# Patient Record
Sex: Male | Born: 1970 | Race: Black or African American | Hispanic: No | Marital: Married | Smoking: Never smoker
Health system: Southern US, Community
[De-identification: ages and names within clinical notes are randomized; demographics above are authoritative.]

## PROBLEM LIST (undated history)

## (undated) DIAGNOSIS — I10 Essential (primary) hypertension: Secondary | ICD-10-CM

## (undated) HISTORY — PX: ACHILLES TENDON REPAIR: SUR1153

## (undated) HISTORY — PX: OTHER SURGICAL HISTORY: SHX169

---

## 2011-02-24 ENCOUNTER — Ambulatory Visit: Payer: Self-pay | Admitting: Internal Medicine

## 2011-02-24 DIAGNOSIS — Z0289 Encounter for other administrative examinations: Secondary | ICD-10-CM

## 2011-12-05 ENCOUNTER — Encounter (HOSPITAL_BASED_OUTPATIENT_CLINIC_OR_DEPARTMENT_OTHER): Payer: Self-pay | Admitting: *Deleted

## 2011-12-05 ENCOUNTER — Emergency Department (HOSPITAL_BASED_OUTPATIENT_CLINIC_OR_DEPARTMENT_OTHER)
Admission: EM | Admit: 2011-12-05 | Discharge: 2011-12-06 | Disposition: A | Discharge status: Home / Self Care | Payer: BC Managed Care – PPO | Attending: Emergency Medicine | Admitting: Emergency Medicine

## 2011-12-05 DIAGNOSIS — R079 Chest pain, unspecified: Secondary | ICD-10-CM | POA: Insufficient documentation

## 2011-12-05 NOTE — ED Provider Notes (Signed)
History  This chart was scribed for Chris Seamen, MD by Erskine Emery. This patient was seen in room MH05/[facility]05 and the patient's care was started at 23:46.   CSN: [id number]  Arrival date & time 12/05/11  2334   First MD Initiated Contact with Patient 12/05/11 2346      Chief Complaint  Patient presents with  . Chest Pain    (Consider location/radiation/quality/duration/timing/severity/associated sxs/prior treatment) The history is provided by the patient. No language interpreter was used.  Chris Burke is a 41 y.o. male who presents to the Emergency Department complaining of sudden onset sharp pain, well localized to the left chest, in episodes of one second since 9 pm tonight. Pt reports about 10 episodes since onset. Pt denies any associated SOB, abdominal pain, nausea, emesis, diaphoresis, cough, or previous episodes of similar symptoms. There doesn't appear to be any mitigating or exacerbation factors.  History reviewed. No pertinent past medical history.  Past Surgical History  Procedure Date  . Achilles     History reviewed. No pertinent family history.  History  Substance Use Topics  . Smoking status: Never Smoker   . Smokeless tobacco: Not on file  . Alcohol Use: No      Review of Systems A complete 10 system review of systems was obtained and all systems are negative except as noted in the HPI and PMH.    Allergies  Review of patient's allergies indicates no known allergies.  Home Medications  No current outpatient prescriptions on file.  Triage Vitals: BP 137/89  Pulse 55  Temp 97.8 F (36.6 C) (Oral)  Resp 16  Ht 5\' 7"  (1.702 m)  Wt 170 lb (77.111 kg)  BMI 26.63 kg/m2  SpO2 100%  Physical Exam  Nursing note and vitals reviewed. Constitutional: He is oriented to person, place, and time. He appears well-developed and well-nourished. No distress.  HENT:  Head: Normocephalic and atraumatic.  Eyes: EOM are normal. Pupils are equal, round, and  reactive to light.  Neck: Neck supple. No tracheal deviation present.  Cardiovascular: Normal rate, regular rhythm and normal heart sounds.   Pulmonary/Chest: Effort normal and breath sounds normal. No respiratory distress.  Abdominal: Soft. He exhibits no distension.  Musculoskeletal: Normal range of motion. He exhibits no edema.  Neurological: He is alert and oriented to person, place, and time.  Skin: Skin is warm and dry.  Psychiatric: He has a normal mood and affect.    ED Course  Procedures (including critical care time) DIAGNOSTIC STUDIES: Oxygen Saturation is 100% on room air, normal by my interpretation.    COORDINATION OF CARE: 23:55--I evaluated the patient and we discussed a treatment plan including chest x-ray and blood work to which the pt agreed. I notified the pt that his EKG looks fine.     MDM  Nursing notes and vitals signs, including pulse oximetry, reviewed.  Summary of this visit's results, reviewed by myself:  Labs:  Results for orders placed during the hospital encounter of 12/05/11  TROPONIN I      Component Value Range   Troponin I <0.30  <0.30 ng/mL    Imaging Studies: Dg Chest 2 View  12/06/2011  *RADIOLOGY REPORT*  Clinical Data: Left-sided chest pain.  CHEST - 2 VIEW  Comparison: None.  Findings: Lateral view degraded by patient arm position.  Midline trachea.  Normal heart size and mediastinal contours. No pleural effusion or pneumothorax.  Clear lungs.  IMPRESSION: No acute cardiopulmonary disease.   Original Report Authenticated  By: Consuello Bossier, M.D.    1:10 AM Patient symptoms are not characteristic of pain of cardiac origin. His EKG is normal except for sinus bradycardia consistent with patient's history of being athletic.    Date: 12/05/2011 11:41 PM  Rate: 56  Rhythm: Sinus bradycardia  QRS Axis: normal  Intervals: normal  ST/T Wave abnormalities: normal  Conduction Disutrbances: none  Narrative Interpretation: unremarkable   Comparison with previous EKG: None available  Well's Score for PE: 0    I personally performed the services described in this documentation, which was scribed in my presence.  The recorded information has been reviewed and considered.       Chris Seamen, MD 12/06/11 0110

## 2011-12-05 NOTE — ED Notes (Signed)
C/o left sided chest pain since pm otnight, denies SOB, no n/v.

## 2011-12-06 ENCOUNTER — Emergency Department (HOSPITAL_BASED_OUTPATIENT_CLINIC_OR_DEPARTMENT_OTHER): Payer: BC Managed Care – PPO

## 2011-12-06 LAB — TROPONIN I: Troponin I: <0.3 ng/mL (ref <0.30)

## 2011-12-06 MED ORDER — ASPIRIN 81 MG PO CHEW
324.0000 mg | CHEWABLE_TABLET | Freq: Once | ORAL | Status: AC
  Administered 2011-12-06: 324 mg via ORAL
  Filled 2011-12-06: qty 4

## 2015-06-14 ENCOUNTER — Encounter (HOSPITAL_BASED_OUTPATIENT_CLINIC_OR_DEPARTMENT_OTHER): Payer: Self-pay | Admitting: *Deleted

## 2015-06-14 ENCOUNTER — Emergency Department (HOSPITAL_BASED_OUTPATIENT_CLINIC_OR_DEPARTMENT_OTHER): Payer: BLUE CROSS/BLUE SHIELD

## 2015-06-14 ENCOUNTER — Emergency Department (HOSPITAL_BASED_OUTPATIENT_CLINIC_OR_DEPARTMENT_OTHER)
Admission: EM | Admit: 2015-06-14 | Discharge: 2015-06-14 | Disposition: A | Discharge status: Home / Self Care | Payer: BLUE CROSS/BLUE SHIELD | Attending: Emergency Medicine | Admitting: Emergency Medicine

## 2015-06-14 DIAGNOSIS — Y998 Other external cause status: Secondary | ICD-10-CM | POA: Insufficient documentation

## 2015-06-14 DIAGNOSIS — Y92410 Unspecified street and highway as the place of occurrence of the external cause: Secondary | ICD-10-CM | POA: Diagnosis not present

## 2015-06-14 DIAGNOSIS — S3992XA Unspecified injury of lower back, initial encounter: Secondary | ICD-10-CM | POA: Diagnosis present

## 2015-06-14 DIAGNOSIS — Y9389 Activity, other specified: Secondary | ICD-10-CM | POA: Insufficient documentation

## 2015-06-14 MED ORDER — CYCLOBENZAPRINE HCL 10 MG PO TABS: 10.0000 mg | ORAL_TABLET | Freq: Two times a day (BID) | ORAL | Status: DC | PRN

## 2015-06-14 MED ORDER — HYDROCODONE-ACETAMINOPHEN 5-325 MG PO TABS: 2.0000 | ORAL_TABLET | ORAL | Status: DC | PRN

## 2015-06-14 MED ORDER — IBUPROFEN 600 MG PO TABS: 600.0000 mg | ORAL_TABLET | Freq: Four times a day (QID) | ORAL | Status: DC | PRN
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
## 2015-06-14 NOTE — ED Notes (Signed)
Pt given d/c instructions as per chart. Verbalizes understanding. No questions. Rx x 3 with narcotic prec instructions.
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
## 2015-06-14 NOTE — Discharge Instructions (Signed)

## 2015-09-30 ENCOUNTER — Emergency Department (HOSPITAL_BASED_OUTPATIENT_CLINIC_OR_DEPARTMENT_OTHER)
Admission: EM | Admit: 2015-09-30 | Discharge: 2015-09-30 | Disposition: A | Discharge status: Home / Self Care | Payer: BLUE CROSS/BLUE SHIELD | Attending: Emergency Medicine | Admitting: Emergency Medicine

## 2015-09-30 ENCOUNTER — Encounter (HOSPITAL_BASED_OUTPATIENT_CLINIC_OR_DEPARTMENT_OTHER): Payer: Self-pay | Admitting: Emergency Medicine

## 2015-09-30 DIAGNOSIS — M542 Cervicalgia: Secondary | ICD-10-CM | POA: Diagnosis present

## 2015-09-30 DIAGNOSIS — M62838 Other muscle spasm: Secondary | ICD-10-CM | POA: Insufficient documentation

## 2015-09-30 MED ORDER — CYCLOBENZAPRINE HCL 10 MG PO TABS: 10.0000 mg | ORAL_TABLET | Freq: Two times a day (BID) | ORAL | 0 refills | Status: DC | PRN

## 2015-09-30 MED ORDER — IBUPROFEN 600 MG PO TABS: 600.0000 mg | ORAL_TABLET | Freq: Four times a day (QID) | ORAL | 0 refills | Status: DC | PRN

## 2015-09-30 NOTE — ED Notes (Signed)
Waiting for edp eval.

## 2015-09-30 NOTE — ED Provider Notes (Signed)
[facility]   CSN: [id number] Arrival date & time: 09/30/15  1431  First Provider Contact:  First MD Initiated Contact with Patient 09/30/15 1520        History   Chief Complaint Chief Complaint  Patient presents with  . Neck Pain    HPI Chris Burke is a 45 y.o. male.  HPI Patient developed a tightening, spasming sensation in the left side of his neck at about 11 PM. No associated injury. He reports it feels a little tighter feet turned his head away from that side of the neck. He has no arm weakness numbness or tingling. No associated headache or jaw pain. No sustained chest pain or shortness of breath. No recent injury. 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 He denies he's doing any significant physical activities however. No past medical history on file.  There are no active problems to display for this patient.   Past Surgical History:  Procedure Laterality Date  . achilles         Home Medications    Prior to Admission medications   Medication Sig Start Date End Date Taking? Authorizing Provider  cyclobenzaprine (FLEXERIL) 10 MG tablet Take 1 tablet (10 mg total) by mouth 2 (two) times daily as needed for muscle spasms. 06/14/15   Nelva Nayobert Beaton, MD  cyclobenzaprine (FLEXERIL) 10 MG tablet Take 1 tablet (10 mg total) by mouth 2 (two) times daily as needed for muscle spasms. 09/30/15   Arby BarretteMarcy Kymora Sciara, MD  HYDROcodone-acetaminophen (NORCO/VICODIN) 5-325 MG tablet Take 2 tablets by mouth every 4 (four) hours as needed. 06/14/15   Nelva Nayobert Beaton, MD  ibuprofen (ADVIL,MOTRIN) 600 MG tablet Take 1 tablet (600 mg total) by mouth every 6 (six) hours as needed. 06/14/15   Nelva Nayobert Beaton, MD  ibuprofen (ADVIL,MOTRIN) 600 MG tablet Take 1 tablet (600 mg total) by mouth every 6 (six) hours as needed. 09/30/15   Arby BarretteMarcy Talin Feister, MD    Family History No family history on file.  Social History Social History    Substance Use Topics  . Smoking status: Never Smoker  . Smokeless tobacco: Never Used  . Alcohol use No     Allergies   Review of patient's allergies indicates no known allergies.   Review of Systems Review of Systems 10 Systems reviewed and are negative for acute change except as noted in the HPI.   Physical Exam Updated Vital Signs BP 128/87 (BP Location: Right Arm)   Pulse (!) 57   Temp 98 F (36.7 C) (Oral)   Resp 18   Ht 5\' 8"  (1.727 m)   Wt 188 lb (85.3 kg)   SpO2 99%   BMI 28.59 kg/m   Physical Exam  Constitutional: He is oriented to person, place, and time. He appears well-developed and well-nourished. No distress.  HENT:  Head: Normocephalic and atraumatic.  Right Ear: External ear normal.  Left Ear: External ear normal.  Nose: Nose normal.  Mouth/Throat: Oropharynx is clear and moist.  Bilateral TMs normal. No facial or jaw swelling.  Eyes: EOM are normal.  Neck: Normal range of motion.  Patient has mild discomfort along the left sternocleidomastoid. He perceives increased discomfort turning his head to the right. No carotid bruit. No palpable masses. No thyromegaly. No meningismus.  Cardiovascular: Normal rate, regular rhythm, normal heart sounds and intact distal pulses.  Exam reveals no gallop and no friction rub.   No murmur heard. Pulmonary/Chest: Effort normal  and breath sounds normal.  Musculoskeletal: Normal range of motion.  Bilateral Upper extremities normal sensation and strength testing.  Neurological: He is alert and oriented to person, place, and time. No cranial nerve deficit. He exhibits normal muscle tone. Coordination normal.  Skin: Skin is warm and dry.  Psychiatric: He has a normal mood and affect.     ED Treatments / Results  Labs (all labs ordered are listed, but only abnormal results are displayed) Labs Reviewed - No data to display  EKG  EKG Interpretation None       Radiology No results  found.  Procedures Procedures (including critical care time)  Medications Ordered in ED Medications - No data to display   Initial Impression / Assessment and Plan / ED Course  I have reviewed the triage vital signs and the nursing notes.  Pertinent labs & imaging results that were available during my care of the patient were reviewed by me and considered in my medical decision making (see chart for details).  Clinical Course     Final Clinical Impressions(s) / ED Diagnoses   Final diagnoses:  Muscle spasms of neck   Patient perceives tightness along the SCM on the left. No specific recalled injury. No associated neurologic dysfunction. At this time recommend ibuprofen and Flexeril. New Prescriptions New Prescriptions   CYCLOBENZAPRINE (FLEXERIL) 10 MG TABLET    Take 1 tablet (10 mg total) by mouth 2 (two) times daily as needed for muscle spasms.   IBUPROFEN (ADVIL,MOTRIN) 600 MG TABLET    Take 1 tablet (600 mg total) by mouth every 6 (six) hours as needed.     Arby Barrette, MD 09/30/15 1540

## 2015-09-30 NOTE — ED Triage Notes (Signed)
Pt having left sided neck pain, started this am.  Pt denies injury.  Pain increased when he moved his neck.
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
## 2023-08-08 ENCOUNTER — Other Ambulatory Visit: Payer: Self-pay

## 2023-08-08 ENCOUNTER — Emergency Department (HOSPITAL_BASED_OUTPATIENT_CLINIC_OR_DEPARTMENT_OTHER): Admission: EM | Admit: 2023-08-08 | Discharge: 2023-08-08 | Disposition: A | Discharge status: Home / Self Care

## 2023-08-08 ENCOUNTER — Encounter (HOSPITAL_BASED_OUTPATIENT_CLINIC_OR_DEPARTMENT_OTHER): Payer: Self-pay

## 2023-08-08 ENCOUNTER — Emergency Department (HOSPITAL_BASED_OUTPATIENT_CLINIC_OR_DEPARTMENT_OTHER)

## 2023-08-08 DIAGNOSIS — E876 Hypokalemia: Secondary | ICD-10-CM | POA: Insufficient documentation

## 2023-08-08 DIAGNOSIS — Z79899 Other long term (current) drug therapy: Secondary | ICD-10-CM | POA: Diagnosis not present

## 2023-08-08 DIAGNOSIS — I10 Essential (primary) hypertension: Secondary | ICD-10-CM | POA: Diagnosis not present

## 2023-08-08 DIAGNOSIS — R072 Precordial pain: Secondary | ICD-10-CM | POA: Diagnosis present

## 2023-08-08 HISTORY — DX: Essential (primary) hypertension: I10

## 2023-08-08 LAB — CBC
HCT: 44 % (ref 39.0–52.0)
Hemoglobin: 15.5 g/dL (ref 13.0–17.0)
MCH: 31.2 pg (ref 26.0–34.0)
MCHC: 35.2 g/dL (ref 30.0–36.0)
MCV: 88.5 fL (ref 80.0–100.0)
Platelets: 239 10*3/uL (ref 150–400)
RBC: 4.97 MIL/uL (ref 4.22–5.81)
RDW: 13.5 % (ref 11.5–15.5)
WBC: 5.2 10*3/uL (ref 4.0–10.5)
nRBC: 0 % (ref 0.0–0.2)

## 2023-08-08 LAB — BASIC METABOLIC PANEL WITH GFR
Anion gap: 13 (ref 5–15)
BUN: 14 mg/dL (ref 6–20)
CO2: 26 mmol/L (ref 22–32)
Calcium: 9.1 mg/dL (ref 8.9–10.3)
Chloride: 100 mmol/L (ref 98–111)
Creatinine, Ser: 1.27 mg/dL — ABNORMAL HIGH (ref 0.61–1.24)
GFR, Estimated: >60 mL/min (ref >60)
Glucose, Bld: 138 mg/dL — ABNORMAL HIGH (ref 70–99)
Potassium: 3 mmol/L — ABNORMAL LOW (ref 3.5–5.1)
Sodium: 139 mmol/L (ref 135–145)

## 2023-08-08 LAB — TROPONIN T, HIGH SENSITIVITY: Troponin T High Sensitivity: <15 ng/L (ref <19)

## 2023-08-08 MED ORDER — POTASSIUM CHLORIDE CRYS ER 20 MEQ PO TBCR
40.0000 meq | EXTENDED_RELEASE_TABLET | Freq: Once | ORAL | Status: AC
  Administered 2023-08-08: 40 meq via ORAL
  Filled 2023-08-08: qty 2

## 2023-08-08 NOTE — Discharge Instructions (Addendum)
 Please read and follow all provided instructions.  Your diagnoses today include:  1. Precordial pain     Tests performed today include: An EKG of your heart A chest x-ray Cardiac enzymes - a blood test for heart muscle damage Blood counts and electrolytes: Potassium was low at 3.0, blood sugar was a little bit elevated at 138 your creatinine (kidney function was slightly elevated at 1.27 Vital signs. See below for your results today.   Medications prescribed:  None  Take any prescribed medications only as directed.  Follow-up instructions: Please follow-up with your primary care provider in 1 week for further evaluation of your symptoms.   If you do have concerns about your cardiac risk factors, you may be a candidate for risk stratification such as with a CT coronary calcium score. This is a conversation to have with your PCP or cardiologist to see if they might think this is appropriate for you.  You may try over-the-counter medication for reflux or GI symptoms to see if this helps as well.  Return instructions:  SEEK IMMEDIATE MEDICAL ATTENTION IF: You have severe chest pain, especially if the pain is crushing or pressure-like and spreads to the arms, back, neck, or jaw, or if you have sweating, nausea or vomiting, or trouble with breathing. THIS IS AN EMERGENCY. Do not wait to see if the pain will go away. Get medical help at once. Call 911. DO NOT drive yourself to the hospital.  Your chest pain gets worse and does not go away after a few minutes of rest.  You have an attack of chest pain lasting longer than what you usually experience.  You have significant dizziness, if you pass out, or have trouble walking.  You have chest pain not typical of your usual pain for which you originally saw your caregiver.  You have any other emergent concerns regarding your health.  Additional Information: Chest pain comes from many different causes. Your caregiver has diagnosed you as having  chest pain that is not specific for one problem, but does not require admission.  You are at low risk for an acute heart condition or other serious illness.   Your vital signs today were: BP (!) 146/96   Pulse 61   Temp 97.8 F (36.6 C) (Oral)   Resp 16   Ht 5' 8 (1.727 m)   Wt 83.9 kg   SpO2 97%   BMI 28.13 kg/m  If your blood pressure (BP) was elevated above 135/85 this visit, please have this repeated by your doctor within one month. --------------

## 2023-08-08 NOTE — ED Triage Notes (Signed)
 C/o constant left sided chest pain since yesterday. Had similar a month ago that resolved after 3 days.

## 2023-08-08 NOTE — ED Provider Notes (Signed)
 [place] [facility] Provider Note   CSN: [id number] Arrival date & time: 08/08/23  4098     Patient presents with: Chest Pain   Chris Burke is a 53 y.o. male.    Patient with history of hypertension, hyperlipidemia, prediabetes --presents to the emergency department for evaluation of left-sided chest pain.  Symptoms started yesterday.  Symptoms were worse this morning, eased off a bit this afternoon but not resolved.  Symptoms started at rest and are not made worse with exertion.  No vomiting or heavy sweating.  Pain does not radiate.  He thinks that it helped a little bit earlier when he was able to belch and also raising his hands up above his head.  Denies change with eating or drinking.  Patient denies history of tobacco use or family history of heart disease.  Patient was concerned because he has had a friend/neighbor have gas pains recently and it turned out to be a heart attack.  He reports having the same pain for 4 days about 1 month ago, resolved spontaneously.  He denies lower extremity swelling, calf or ankle pain.  He reports driving home from [place]  about a month ago.  Denies history of blood clots or recent surgeries.       Prior to Admission medications   Medication Sig Start Date End Date Taking? Authorizing Provider  cyclobenzaprine  (FLEXERIL ) 10 MG tablet Take 1 tablet (10 mg total) by mouth 2 (two) times daily as needed for muscle spasms. 06/14/15   Lonia Ro, MD  cyclobenzaprine  (FLEXERIL ) 10 MG tablet Take 1 tablet (10 mg total) by mouth 2 (two) times daily as needed for muscle spasms. 09/30/15   Wynetta Heckle, MD  HYDROcodone -acetaminophen  (NORCO/VICODIN) 5-325 MG tablet Take 2 tablets by mouth every 4 (four) hours as needed. 06/14/15   Lonia Ro, MD  ibuprofen  (ADVIL ,MOTRIN ) 600 MG tablet Take 1 tablet (600 mg total) by mouth every 6 (six) hours as needed. 06/14/15   Lonia Ro, MD  ibuprofen  (ADVIL ,MOTRIN ) 600  MG tablet Take 1 tablet (600 mg total) by mouth every 6 (six) hours as needed. 09/30/15   Wynetta Heckle, MD    Allergies: Patient has no known allergies.    Review of Systems  Updated Vital Signs BP (!) 146/96   Pulse 61   Temp 97.8 F (36.6 C) (Oral)   Resp 16   Ht 5' 8 (1.727 m)   Wt 83.9 kg   SpO2 97%   BMI 28.13 kg/m   Physical Exam Vitals and nursing note reviewed.  Constitutional:      Appearance: He is well-developed. He is not diaphoretic.  HENT:     Head: Normocephalic and atraumatic.     Mouth/Throat:     Mouth: Mucous membranes are not dry.   Eyes:     Conjunctiva/sclera: Conjunctivae normal.   Neck:     Vascular: Normal carotid pulses. No carotid bruit or JVD.     Trachea: Trachea normal. No tracheal deviation.   Cardiovascular:     Rate and Rhythm: Normal rate and regular rhythm.     Pulses: No decreased pulses.          Radial pulses are 2+ on the right side and 2+ on the left side.     Heart sounds: Normal heart sounds, S1 normal and S2 normal. Heart sounds not distant. No murmur heard. Pulmonary:     Effort: Pulmonary effort is normal. No respiratory distress.     Breath  sounds: Normal breath sounds. No wheezing.  Chest:     Chest wall: No tenderness.  Abdominal:     General: Bowel sounds are normal.     Palpations: Abdomen is soft.     Tenderness: There is no abdominal tenderness. There is no guarding or rebound.   Musculoskeletal:     Cervical back: Normal range of motion and neck supple. No muscular tenderness.     Right lower leg: No edema.     Left lower leg: No edema.   Skin:    General: Skin is warm and dry.     Coloration: Skin is not pale.   Neurological:     Mental Status: He is alert. Mental status is at baseline.   Psychiatric:        Mood and Affect: Mood normal.    ED Course  Patient seen and examined. History obtained directly from patient.   Labs/EKG: Ordered CBC, BMP, troponin.  EKG personally reviewed and  interpreted as below.  Imaging: Ordered chest x-ray.  Medications/Fluids: None ordered  Most recent vital signs reviewed and are as follows: BP (!) 144/93 (BP Location: Right Arm)   Pulse 67   Temp 97.8 F (36.6 C) (Oral)   Resp 20   Ht 5' 8 (1.727 m)   Wt 83.9 kg   SpO2 100%   BMI 28.13 kg/m   Initial impression: Atypical chest pain  5:34 PM Reassessment performed. Patient appears comfortable.  Wife now at bedside.  Labs personally reviewed and interpreted including: CBC unremarkable; BMP with slightly low potassium at 3.0, glucose 138, creatinine 1.27 otherwise unremarkable; troponin T less than 15.  Imaging personally visualized and interpreted including: Chest x-ray negative.  Reviewed pertinent lab work and imaging with patient at bedside. Questions answered.   Most current vital signs reviewed and are as follows: BP (!) 146/96   Pulse 61   Temp 97.8 F (36.6 C) (Oral)   Resp 16   Ht 5' 8 (1.727 m)   Wt 83.9 kg   SpO2 97%   BMI 28.13 kg/m   Plan: Discharge to home.  Will give oral potassium supplement prior to discharge.  Prescriptions written for: None  Other home care instructions discussed: Discussed trying an over-the-counter medication for GI symptoms such as omeprazole or Pepcid.  Family member states that they have Zegerid at home and he may try this.  In regards to hypokalemia, advised good dietary intake of hypokalemia.  Patient is not on any medications which would chronically lower his potassium and I do not feel strongly that he requires a oral supplement at this time, beyond the dose in the ED.  Return and follow-up instructions: I encouraged patient to return to ED with severe chest pain, especially if the pain is crushing or pressure-like and spreads to the arms, back, neck, or jaw, or if they have associated sweating, vomiting, or shortness of breath with the pain, or significant pain with activity. We discussed that the evaluation here today  indicates a low-risk of serious cause of chest pain, including heart trouble or a blood clot, but no evaluation is perfect and chest pain can evolve with time. The patient verbalized understanding and agreed.  I encouraged patient to follow-up with their provider in the next 1 week for recheck.     (all labs ordered are listed, but only abnormal results are displayed) Labs Reviewed - No data to display  EKG: None  Radiology: No results found.   Procedures  Medications Ordered in the ED - No data to display                                  Medical Decision Making Amount and/or Complexity of Data Reviewed Labs: ordered. Radiology: ordered.  Risk Prescription drug management.   For this patient's complaint of chest pain, the following emergent conditions were considered on the differential diagnosis: acute coronary syndrome, pulmonary embolism, pneumothorax, myocarditis, pericardial tamponade, aortic dissection, thoracic aortic aneurysm complication, esophageal perforation.   Other causes were also considered including: gastroesophageal reflux disease, musculoskeletal pain including costochondritis, pneumonia/pleurisy, herpes zoster, pericarditis.  In regards to possibility of ACS, patient has atypical features of pain, non-ischemic and unchanged EKG and negative troponin. Heart score was calculated to be 3.   In regards to possibility of PE, symptoms are atypical for PE and risk profile is low, and PE felt to be low likelihood.  No clinical signs or symptoms of DVT.  The patient's vital signs, pertinent lab work and imaging were reviewed and interpreted as discussed in the ED course. Hospitalization was considered for further testing, treatments, or serial exams/observation. However as patient is well-appearing, has a stable exam, and reassuring studies today, I do not feel that they warrant admission at this time. This plan was discussed with the patient who verbalizes agreement  and comfort with this plan and seems reliable and able to return to the Emergency Department with worsening or changing symptoms.       Final diagnoses:  Precordial pain  Hypokalemia    ED Discharge Orders     None          Lyna Sandhoff, PA-C 08/08/23 1737    Rolinda Climes, DO 08/08/23 [phone]
